# Patient Record
Sex: Male | Born: 1985 | Race: White | Hispanic: No | Marital: Single | Smoking: Current every day smoker
Health system: Southern US, Community
[De-identification: ages and names within clinical notes are randomized; demographics above are authoritative.]

## PROBLEM LIST (undated history)

## (undated) DIAGNOSIS — F329 Major depressive disorder, single episode, unspecified: Secondary | ICD-10-CM

## (undated) DIAGNOSIS — M549 Dorsalgia, unspecified: Secondary | ICD-10-CM

## (undated) DIAGNOSIS — F419 Anxiety disorder, unspecified: Secondary | ICD-10-CM

## (undated) DIAGNOSIS — F1020 Alcohol dependence, uncomplicated: Secondary | ICD-10-CM

## (undated) DIAGNOSIS — F32A Depression, unspecified: Secondary | ICD-10-CM

---

## 2010-04-12 ENCOUNTER — Emergency Department (HOSPITAL_COMMUNITY): Admission: EM | Admit: 2010-04-12 | Discharge: 2010-04-12 | Payer: Self-pay | Admitting: Emergency Medicine

## 2010-08-21 ENCOUNTER — Emergency Department (HOSPITAL_COMMUNITY): Admission: EM | Admit: 2010-08-21 | Discharge: 2010-08-21 | Payer: Self-pay | Admitting: Emergency Medicine

## 2011-04-28 ENCOUNTER — Ambulatory Visit (INDEPENDENT_AMBULATORY_CARE_PROVIDER_SITE_OTHER): Payer: Self-pay

## 2011-04-28 ENCOUNTER — Inpatient Hospital Stay (INDEPENDENT_AMBULATORY_CARE_PROVIDER_SITE_OTHER)
Admission: RE | Admit: 2011-04-28 | Discharge: 2011-04-28 | Disposition: A | Discharge status: Home / Self Care | Payer: Self-pay | Source: Ambulatory Visit | Attending: Emergency Medicine | Admitting: Emergency Medicine

## 2011-04-28 DIAGNOSIS — S61409A Unspecified open wound of unspecified hand, initial encounter: Secondary | ICD-10-CM

## 2011-10-06 ENCOUNTER — Emergency Department (HOSPITAL_COMMUNITY)
Admission: EM | Admit: 2011-10-06 | Discharge: 2011-10-06 | Disposition: A | Discharge status: Home / Self Care | Payer: Self-pay | Attending: Emergency Medicine | Admitting: Emergency Medicine

## 2011-10-06 DIAGNOSIS — F1010 Alcohol abuse, uncomplicated: Secondary | ICD-10-CM | POA: Insufficient documentation

## 2011-10-06 DIAGNOSIS — M549 Dorsalgia, unspecified: Secondary | ICD-10-CM | POA: Insufficient documentation

## 2012-01-22 ENCOUNTER — Encounter (HOSPITAL_COMMUNITY): Payer: Self-pay | Admitting: *Deleted

## 2012-01-22 ENCOUNTER — Emergency Department (HOSPITAL_COMMUNITY)
Admission: EM | Admit: 2012-01-22 | Discharge: 2012-01-22 | Disposition: A | Discharge status: Home / Self Care | Payer: Self-pay | Attending: Emergency Medicine | Admitting: Emergency Medicine

## 2012-01-22 DIAGNOSIS — X789XXA Intentional self-harm by unspecified sharp object, initial encounter: Secondary | ICD-10-CM | POA: Insufficient documentation

## 2012-01-22 DIAGNOSIS — S61509A Unspecified open wound of unspecified wrist, initial encounter: Secondary | ICD-10-CM | POA: Insufficient documentation

## 2012-01-22 DIAGNOSIS — F17200 Nicotine dependence, unspecified, uncomplicated: Secondary | ICD-10-CM | POA: Insufficient documentation

## 2012-01-22 DIAGNOSIS — S61512A Laceration without foreign body of left wrist, initial encounter: Secondary | ICD-10-CM

## 2012-01-22 DIAGNOSIS — F39 Unspecified mood [affective] disorder: Secondary | ICD-10-CM | POA: Insufficient documentation

## 2012-01-22 HISTORY — DX: Alcohol dependence, uncomplicated: F10.20

## 2012-01-22 HISTORY — DX: Anxiety disorder, unspecified: F41.9

## 2012-01-22 HISTORY — DX: Major depressive disorder, single episode, unspecified: F32.9

## 2012-01-22 HISTORY — DX: Depression, unspecified: F32.A

## 2012-01-22 LAB — COMPREHENSIVE METABOLIC PANEL
Albumin: 4 g/dL (ref 3.5–5.2)
BUN: 5 mg/dL — ABNORMAL LOW (ref 6–23)
Chloride: 105 mEq/L (ref 96–112)
Creatinine, Ser: 0.61 mg/dL (ref 0.50–1.35)
Total Bilirubin: 0.2 mg/dL — ABNORMAL LOW (ref 0.3–1.2)
Total Protein: 7.9 g/dL (ref 6.0–8.3)

## 2012-01-22 LAB — CBC
HCT: 48.8 % (ref 39.0–52.0)
MCHC: 36.5 g/dL — ABNORMAL HIGH (ref 30.0–36.0)
MCV: 87 fL (ref 78.0–100.0)
RDW: 12.9 % (ref 11.5–15.5)

## 2012-01-22 LAB — RAPID URINE DRUG SCREEN, HOSP PERFORMED: Benzodiazepines: NOT DETECTED

## 2012-01-22 LAB — ETHANOL: Alcohol, Ethyl (B): 254 mg/dL — ABNORMAL HIGH (ref 0–11)

## 2012-01-22 LAB — ACETAMINOPHEN LEVEL: Acetaminophen (Tylenol), Serum: <15 ug/mL (ref 10–30)

## 2012-01-22 MED ORDER — ACETAMINOPHEN 325 MG PO TABS
650.0000 mg | ORAL_TABLET | ORAL | Status: DC | PRN
  Administered 2012-01-22: 650 mg via ORAL
  Filled 2012-01-22: qty 2

## 2012-01-22 MED ORDER — ALUM & MAG HYDROXIDE-SIMETH 200-200-20 MG/5ML PO SUSP: 30.0000 mL | ORAL | Status: DC | PRN

## 2012-01-22 MED ORDER — LORAZEPAM 1 MG PO TABS
1.0000 mg | ORAL_TABLET | Freq: Three times a day (TID) | ORAL | Status: DC | PRN
  Administered 2012-01-22: 1 mg via ORAL
  Filled 2012-01-22: qty 1

## 2012-01-22 MED ORDER — NICOTINE 21 MG/24HR TD PT24
21.0000 mg | MEDICATED_PATCH | Freq: Every day | TRANSDERMAL | Status: DC
  Administered 2012-01-22 (×2): 21 mg via TRANSDERMAL
  Filled 2012-01-22: qty 1

## 2012-01-22 MED ORDER — LIDOCAINE-EPINEPHRINE 2 %-1:100000 IJ SOLN
30.0000 mL | Freq: Once | INTRAMUSCULAR | Status: AC
  Administered 2012-01-22: 30 mL via INTRADERMAL
  Filled 2012-01-22: qty 1

## 2012-01-22 MED ORDER — ONDANSETRON HCL 4 MG PO TABS: 4.0000 mg | ORAL_TABLET | Freq: Three times a day (TID) | ORAL | Status: DC | PRN

## 2012-01-22 NOTE — ED Notes (Signed)
Telepsych number called x 4 and the number checked x2.  Number not working.

## 2012-01-22 NOTE — BH Assessment (Signed)
Assessment Note   Leonard Pacheco is an 26 y.o. male. Pt presents to the ED stating that his moods are from one extreme to the other. Patient says, "I'm happy one moment and the next I am sad". Sts that last night he cut the top of his forearm. He denies that this was a suicide attempt. He does however state that this was a attempt to make himself feel better. He does not have a history of cutting but wanted to try and sts, "It helped him feel better". He denies that he is currently suicidal or has ever been suicidal. Patient is able to contract for safety. No HI. No drug use but patient drinks heavily 4x's per week. Patient drinks a 12 pack of beer or a 5th of liquor with each episode or drinking. He last drank last night--12 pack of beer.    Axis I: Alcohol Abuse and Mood Disorder NOS Axis II: Deferred Axis III:  Past Medical History  Diagnosis Date  . Depression   . Alcoholism   . Anxiety    Axis IV: economic problems, other psychosocial or environmental problems, problems related to social environment, problems with access to health care services and problems with primary support group Axis V: 51-60 moderate symptoms  Past Medical History:  Past Medical History  Diagnosis Date  . Depression   . Alcoholism   . Anxiety     History reviewed. No pertinent past surgical history.  Family History: History reviewed. No pertinent family history.  Social History:  reports that he has been smoking Cigarettes.  He has a 5 pack-year smoking history. He does not have any smokeless tobacco history on file. He reports that he drinks about 14.4 ounces of alcohol per week. He reports that he does not use illicit drugs.  Additional Social History:  Alcohol / Drug Use Pain Medications: pt denies Prescriptions: no home meds noted Over the Counter: pt denies History of alcohol / drug use?: Yes Longest period of sobriety (when/how long): n/a Substance #1 Name of Substance 1: Alcoholl-beer or  liqour 1 - Age of First Use: 26 yrs old 1 - Amount (size/oz): 12 pack of beer or 5th of liqour 1 - Frequency: 4x per week 1 - Duration: on-going 1 - Last Use / Amount: last night 01/21/2012 pt drank a 12 pack of beer Allergies: No Known Allergies  Home Medications:  Medications Prior to Admission  Medication Dose Route Frequency Provider Last Rate Last Dose  . acetaminophen (TYLENOL) tablet 650 mg  650 mg Oral Q4H PRN Carlisle Beers Molpus, MD   650 mg at 01/22/12 0828  . alum & mag hydroxide-simeth (MAALOX/MYLANTA) 200-200-20 MG/5ML suspension 30 mL  30 mL Oral PRN John L Molpus, MD      . lidocaine-EPINEPHrine (XYLOCAINE W/EPI) 2 %-1:100000 (with pres) injection 30 mL  30 mL Intradermal Once Peter A. Tucich, MD   30 mL at 01/22/12 0535  . LORazepam (ATIVAN) tablet 1 mg  1 mg Oral Q8H PRN Carlisle Beers Molpus, MD   1 mg at 01/22/12 0828  . nicotine (NICODERM CQ - dosed in mg/24 hours) patch 21 mg  21 mg Transdermal Daily Carlisle Beers Molpus, MD   21 mg at 01/22/12 0828  . ondansetron (ZOFRAN) tablet 4 mg  4 mg Oral Q8H PRN Hanley Seamen, MD       No current outpatient prescriptions on file as of 01/22/2012.    OB/GYN Status:  No LMP for male patient.  General  Assessment Data Location of Assessment: [facility] ED ACT Assessment:  (No) Living Arrangements: Spouse/significant other;Other (Comment) (lives with fiance) Can pt return to current living arrangement?: Yes Admission Status: Voluntary Is patient capable of signing voluntary admission?: Yes Transfer from: Acute Hospital Referral Source: Self/Family/Friend  Education Status Is patient currently in school?: No  Risk to self Suicidal Ideation: No Suicidal Intent: No Is patient at risk for suicide?: No Suicidal Plan?: No Access to Means: No What has been your use of drugs/alcohol within the last 12 months?:  (pt reports heavy alcohol use x4 days per week) Previous Attempts/Gestures: No How many times?:  (n/a) Other Self Harm Risks:  (cutting to  relieve pain, per patient) Triggers for Past Attempts:  (n/a) Intentional Self Injurious Behavior: Cutting Comment - Self Injurious Behavior:  (pt cut top of forearm last night (first time);requires stitc) Family Suicide History: No (Family History of substance abuse only) Recent stressful life event(s):  (fiance threatening to abort child, argues w/ fiance,bereavem) Persecutory voices/beliefs?: No Depression: Yes Depression Symptoms: Feeling angry/irritable;Fatigue;Loss of interest in usual pleasures Substance abuse history and/or treatment for substance abuse?: Yes (Pt received treatment in prison 2010) Suicide prevention information given to non-admitted patients: Not applicable  Risk to Others Homicidal Ideation: No Thoughts of Harm to Others: No Current Homicidal Intent: No Current Homicidal Plan: No Access to Homicidal Means: No Identified Victim:  (n/a) History of harm to others?: No Assessment of Violence: None Noted Violent Behavior Description:  (n/a) Does patient have access to weapons?: No Criminal Charges Pending?: No Does patient have a court date: No  Psychosis Hallucinations: None noted Delusions: None noted  Mental Status Report Appear/Hygiene: Disheveled Eye Contact: Fair Motor Activity: Agitation;Restlessness Speech: Logical/coherent Level of Consciousness: Alert;Irritable Mood: Depressed;Anxious;Sad Affect: Anxious;Depressed;Sad Anxiety Level: None Thought Processes: Coherent;Relevant Judgement: Unimpaired Orientation: Person;Place;Time;Situation Obsessive Compulsive Thoughts/Behaviors: None  Cognitive Functioning Concentration: Normal Memory: Recent Intact;Remote Intact IQ: Average Insight: Fair Impulse Control: Poor Appetite: Poor Weight Loss:  (15-20 pounds in 2 weeks) Weight Gain:  (none reported) Sleep: Decreased Total Hours of Sleep:  (4-5 hrs per night) Vegetative Symptoms: Staying in bed  Prior Inpatient Therapy Prior Inpatient  Therapy: Yes Prior Therapy Dates:  (2010) Prior Therapy Facilty/Provider(s):  (pt received tx while in served time in prison;2010) Reason for Treatment:  (substance abuse-alcoholism)  Prior Outpatient Therapy Prior Outpatient Therapy: Yes Prior Therapy Dates:  (2010) Prior Therapy Facilty/Provider(s):  ([facility]) Reason for Treatment:  (alcoholism)  ADL Screening (condition at time of admission) Patient's cognitive ability adequate to safely complete daily activities?: Yes Patient able to express need for assistance with ADLs?: Yes Independently performs ADLs?: Yes Weakness of Legs: None Weakness of Arms/Hands: None  Home Assistive Devices/Equipment Home Assistive Devices/Equipment: None    Abuse/Neglect Assessment (Assessment to be complete while patient is alone) Physical Abuse: Denies Verbal Abuse: Denies Sexual Abuse: Denies Exploitation of patient/patient's resources: Denies Self-Neglect: Denies Values / Beliefs Cultural Requests During Hospitalization: None Spiritual Requests During Hospitalization: None Consults Spiritual Care Consult Needed: No Social Work Consult Needed: No Merchant navy officer (For Healthcare) Advance Directive: Patient does not have advance directive Nutrition Screen Diet: Regular  Additional Information 1:1 In Past 12 Months?: No CIRT Risk: No Elopement Risk: No Does patient have medical clearance?: Yes     Disposition:  Disposition Disposition of Patient:  (Disposition pending telepsych consult)  On Site Evaluation by:   Reviewed with Physician:     Octaviano Batty 01/22/2012 10:30 AM

## 2012-01-22 NOTE — ED Notes (Signed)
Pt comes from home where he was drinking beer and thought it a good idea to cut his arm in an attempt to harm himself.  Pt presents with a 1 inch (approximately) laceration on his right lateral, distal forearm.  Pt stating that he would like to speak to a psychologist.

## 2012-01-22 NOTE — ED Provider Notes (Signed)
History     CSN: [id number]  Arrival date & time 01/22/12  0103   First MD Initiated Contact with Patient 01/22/12 [phone]      Chief Complaint  Patient presents with  . Suicidal with self-inflicted injury      HPI  History provided by the patient. Patient is 26 year old male with no known significant past medical history who presents with complaints of self-inflicted laceration to left wrist, depression and mood swings. Patient admits to heavy alcohol use this evening. Patient states that he became very emotional rapidly and angry due to the relationship and he cut his left wrist. he states he did this as active anger denies an active potential harm to himself or suicidal thoughts. he reports having similar outbursts and emotional mood swings for several months. Patient is requesting to be evaluated for possible bipolar disorder. Patient denies any SI/HI to me. Patient reports being up-to-date on his tetanus shot with last in 2010.   Past Medical History  Diagnosis Date  . Depression   . Alcoholism     History reviewed. No pertinent past surgical history.  History reviewed. No pertinent family history.  History  Substance Use Topics  . Smoking status: Current Everyday Smoker -- 1.0 packs/day for 5 years    Types: Cigarettes  . Smokeless tobacco: Not on file  . Alcohol Use: Not on file      Review of Systems  All other systems reviewed and are negative.    Allergies  Review of patient's allergies indicates no known allergies.  Home Medications  No current outpatient prescriptions on file.  BP 168/84  Pulse 98  Temp(Src) 98.1 F (36.7 C) (Oral)  Resp 18  SpO2 97%  Physical Exam  Nursing note and vitals reviewed. Constitutional: He is oriented to person, place, and time. He appears well-developed and well-nourished. No distress.  HENT:  Head: Normocephalic and atraumatic.  Cardiovascular: Normal rate and regular rhythm.   Pulmonary/Chest: Effort normal and  breath sounds normal. No respiratory distress. He has no wheezes.  Abdominal: Soft.  Neurological: He is alert and oriented to person, place, and time.  Skin: Skin is warm. No rash noted.       2 cm laceration to the dorsal left forearm over radial aspect.  Psychiatric: His behavior is normal.    ED Course  Procedures   LACERATION REPAIR Performed by: Angus Seller Authorized by: Angus Seller Consent: Verbal consent obtained. Risks and benefits: risks, benefits and alternatives were discussed Consent given by: patient Patient identity confirmed: provided demographic data Prepped and Draped in normal sterile fashion Wound explored  Laceration Location: Lateral dorsal left wrist  Laceration Length: 2 cm  No Foreign Bodies seen or palpated  Anesthesia: local infiltration  Local anesthetic: lidocaine 2% with epinephrine  Anesthetic total: 2 ml  Irrigation method: syringe Amount of cleaning: standard  Skin closure: Skin with 4-0 nylon   Number of sutures: 4   Technique: Simple interrupted   Patient tolerance: Patient tolerated the procedure well with no immediate complications.     Results for orders placed during the hospital encounter of 01/22/12  CBC      Component Value Range   WBC 10.6 (*) 4.0 - 10.5 (K/uL)   RBC 5.61  4.22 - 5.81 (MIL/uL)   Hemoglobin 17.8 (*) 13.0 - 17.0 (g/dL)   HCT 56.2  13.0 - 86.5 (%)   MCV 87.0  78.0 - 100.0 (fL)   MCH 31.7  26.0 - 34.0 (pg)  MCHC 36.5 (*) 30.0 - 36.0 (g/dL)   RDW 16.1  09.6 - 04.5 (%)   Platelets 240  150 - 400 (K/uL)  COMPREHENSIVE METABOLIC PANEL      Component Value Range   Sodium 143  135 - 145 (mEq/L)   Potassium 3.0 (*) 3.5 - 5.1 (mEq/L)   Chloride 105  96 - 112 (mEq/L)   CO2 23  19 - 32 (mEq/L)   Glucose, Bld 127 (*) 70 - 99 (mg/dL)   BUN 5 (*) 6 - 23 (mg/dL)   Creatinine, Ser 4.09  0.50 - 1.35 (mg/dL)   Calcium 9.2  8.4 - 81.1 (mg/dL)   Total Protein 7.9  6.0 - 8.3 (g/dL)   Albumin 4.0  3.5 -  5.2 (g/dL)   AST 19  0 - 37 (U/L)   ALT 22  0 - 53 (U/L)   Alkaline Phosphatase 74  39 - 117 (U/L)   Total Bilirubin 0.2 (*) 0.3 - 1.2 (mg/dL)   GFR calc non Af Amer >90  >90 (mL/min)   GFR calc Af Amer >90  >90 (mL/min)  ETHANOL      Component Value Range   Alcohol, Ethyl (B) 254 (*) 0 - 11 (mg/dL)  ACETAMINOPHEN LEVEL      Component Value Range   Acetaminophen (Tylenol), Serum <15.0  10 - 30 (ug/mL)  URINE RAPID DRUG SCREEN (HOSP PERFORMED)      Component Value Range   Opiates NONE DETECTED  NONE DETECTED    Cocaine NONE DETECTED  NONE DETECTED    Benzodiazepines NONE DETECTED  NONE DETECTED    Amphetamines NONE DETECTED  NONE DETECTED    Tetrahydrocannabinol NONE DETECTED  NONE DETECTED    Barbiturates NONE DETECTED  NONE DETECTED         1. Mood disorder   2. Laceration of wrist, left       MDM  3:20 AM patient seen and evaluated. Patient no acute distress. Patient with small laceration to left wrist with self-inflicted. Patient states he was doing this as a suicide attempt or any intention to kill himself. Patient states that he thinks he may have bipolar disorder but has never been evaluated and is requesting psychology consult.  Spoke with [facility] act team they will see patient in consult. Discussed patient with attending physician. Will also order tele-psych consultation.      Angus Seller, [place] 01/22/12 [phone]

## 2012-01-22 NOTE — ED Notes (Signed)
Pt sleeping soundly, resp even and unlabored.  Pt waiting for consult and disposition.

## 2012-01-22 NOTE — [facility] Counselor (Signed)
Patient discharged home by Dr. Patrica Duel per the telepsych consult. Writer provided patient with various referrals for therapist, psychiatrist, and various substance abuse programs.

## 2012-01-22 NOTE — ED Provider Notes (Signed)
Medical screening examination/treatment/procedure(s) were conducted as a shared visit with non-physician practitioner(s) and myself.  I personally evaluated the patient during the encounter  Laceration to left wrist, sutured by PA. Patient advised of plan.  Hanley Seamen, MD 01/22/12 [phone]

## 2012-01-22 NOTE — ED Notes (Signed)
Pt now awake, denies any pain at this time.  States he is just hungry. Pt denies any c/o at this time.

## 2012-01-22 NOTE — ED Provider Notes (Addendum)
8:42 AM'  Psychiatric evaluation is ongoing. Disposition is currently pending. Patient has no current complaints and remains stable at this time.    Jahlon Baines A. Patrica Duel, MD 01/22/12 [phone]   Telepsych was completed in regard to the impulsivity, left forearm when he was angry with his girlfriend. Patient had apparently realized that his brother and his girlfriend were having a relation. According to the psychiatrist and the Telepsych note that there was no evidence of delusional thought process or cognitive impairment. He denied any plan or intent to harm himself or others. He does have some alcohol problems in the psychiatrist recommended benefit from outpatient treatment. Otherwise, per Telepsych height is okay to discharge home. Recommend alcohol and drug program and community mental health. Third for discharge. No psych medications indicated at this time. According to Dr. Renee Ramus A. Patrica Duel, MD 01/22/12 1242

## 2012-01-22 NOTE — ED Notes (Signed)
It is confirmed with security that the pt and his belongings have been wanded by security.  Pt's belongings are at the nurse's desk.

## 2012-01-22 NOTE — ED Notes (Signed)
Pt states he has been depressed since his father's death in 06-02-09.  Abusing alcohol.  Says others are trying to use him and he is tired of it.  Denies SI/HI but made a 2cm lac on left forearm.  Been drinking alcohol.

## 2015-07-18 ENCOUNTER — Encounter (HOSPITAL_COMMUNITY): Payer: Self-pay | Admitting: *Deleted

## 2015-07-18 ENCOUNTER — Emergency Department (HOSPITAL_COMMUNITY)
Admission: EM | Admit: 2015-07-18 | Discharge: 2015-07-18 | Disposition: A | Discharge status: Home / Self Care | Payer: Self-pay | Attending: Emergency Medicine | Admitting: Emergency Medicine

## 2015-07-18 DIAGNOSIS — Z8659 Personal history of other mental and behavioral disorders: Secondary | ICD-10-CM | POA: Insufficient documentation

## 2015-07-18 DIAGNOSIS — T50901A Poisoning by unspecified drugs, medicaments and biological substances, accidental (unintentional), initial encounter: Secondary | ICD-10-CM

## 2015-07-18 DIAGNOSIS — R61 Generalized hyperhidrosis: Secondary | ICD-10-CM | POA: Insufficient documentation

## 2015-07-18 DIAGNOSIS — R002 Palpitations: Secondary | ICD-10-CM | POA: Insufficient documentation

## 2015-07-18 DIAGNOSIS — Z720 Tobacco use: Secondary | ICD-10-CM | POA: Insufficient documentation

## 2015-07-18 DIAGNOSIS — R000 Tachycardia, unspecified: Secondary | ICD-10-CM | POA: Insufficient documentation

## 2015-07-18 DIAGNOSIS — T44901A Poisoning by unspecified drugs primarily affecting the autonomic nervous system, accidental (unintentional), initial encounter: Secondary | ICD-10-CM | POA: Insufficient documentation

## 2015-07-18 HISTORY — DX: Dorsalgia, unspecified: M54.9

## 2015-07-18 LAB — CBC WITH DIFFERENTIAL/PLATELET
Basophils Absolute: 0 10*3/uL (ref 0.0–0.1)
Basophils Relative: 0 % (ref 0–1)
EOS ABS: 0.1 10*3/uL (ref 0.0–0.7)
EOS PCT: 1 % (ref 0–5)
HCT: 46.2 % (ref 39.0–52.0)
HEMOGLOBIN: 16.1 g/dL (ref 13.0–17.0)
LYMPHS ABS: 1.6 10*3/uL (ref 0.7–4.0)
Lymphocytes Relative: 11 % — ABNORMAL LOW (ref 12–46)
MCH: 30.6 pg (ref 26.0–34.0)
MCHC: 34.8 g/dL (ref 30.0–36.0)
MCV: 87.8 fL (ref 78.0–100.0)
MONO ABS: 0.6 10*3/uL (ref 0.1–1.0)
Monocytes Relative: 4 % (ref 3–12)
NEUTROS ABS: 12.1 10*3/uL — AB (ref 1.7–7.7)
NEUTROS PCT: 84 % — AB (ref 43–77)
PLATELETS: 221 10*3/uL (ref 150–400)
RBC: 5.26 MIL/uL (ref 4.22–5.81)
RDW: 12.7 % (ref 11.5–15.5)
WBC: 14.4 10*3/uL — AB (ref 4.0–10.5)

## 2015-07-18 LAB — I-STAT CHEM 8, ED
BUN: 29 mg/dL — AB (ref 6–20)
Calcium, Ion: 1.07 mmol/L — ABNORMAL LOW (ref 1.12–1.23)
Chloride: 103 mmol/L (ref 101–111)
Creatinine, Ser: 0.9 mg/dL (ref 0.61–1.24)
Glucose, Bld: 234 mg/dL — ABNORMAL HIGH (ref 65–99)
HCT: 46 % (ref 39.0–52.0)
Hemoglobin: 15.6 g/dL (ref 13.0–17.0)
POTASSIUM: 3.6 mmol/L (ref 3.5–5.1)
SODIUM: 138 mmol/L (ref 135–145)
TCO2: 23 mmol/L (ref 0–100)

## 2015-07-18 MED ORDER — SODIUM CHLORIDE 0.9 % IV BOLUS (SEPSIS)
1000.0000 mL | Freq: Once | INTRAVENOUS | Status: AC
  Administered 2015-07-18: 1000 mL via INTRAVENOUS

## 2015-07-18 MED ORDER — IBUPROFEN 400 MG PO TABS
400.0000 mg | ORAL_TABLET | Freq: Once | ORAL | Status: AC
  Administered 2015-07-18: 400 mg via ORAL
  Filled 2015-07-18: qty 1

## 2015-07-18 MED ORDER — LORAZEPAM 2 MG/ML IJ SOLN: 1.0000 mg | Freq: Once | INTRAMUSCULAR | Status: DC

## 2015-07-18 NOTE — Discharge Instructions (Signed)
°Emergency Department Resource Guide °1) Find a Doctor and Pay Out of Pocket °Although you won't have to find out who is covered by your insurance plan, it is a good idea to ask around and get recommendations. You will then need to call the office and see if the doctor you have chosen will accept you as a new patient and what types of options they offer for patients who are self-pay. Some doctors offer discounts or will set up payment plans for their patients who do not have insurance, but you will need to ask so you aren't surprised when you get to your appointment. ° °2) Contact Your Local Health Department °Not all health departments have doctors that can see patients for sick visits, but many do, so it is worth a call to see if yours does. If you don't know where your local health department is, you can check in your phone book. The CDC also has a tool to help you locate your state's health department, and many state websites also have listings of all of their local health departments. ° °3) Find a Walk-in Clinic °If your illness is not likely to be very severe or complicated, you may want to try a walk in clinic. These are popping up all over the country in pharmacies, drugstores, and shopping centers. They're usually staffed by nurse practitioners or physician assistants that have been trained to treat common illnesses and complaints. They're usually fairly quick and inexpensive. However, if you have serious medical issues or chronic medical problems, these are probably not your best option. ° °No Primary Care Doctor: °- Call [facility] at  832-8000 - they can help you locate a primary care doctor that  accepts your insurance, provides certain services, etc. °- Physician Referral Service- 1-800-533-3463 ° °Chronic Pain Problems: °Organization         Address  Phone   Notes  °[facility]  (336) [phone] Patients need to be referred by their primary care doctor.  ° °Medication  Assistance: °Organization         Address  Phone   Notes  °[place] [facility] [address] °[place], [place] [address] (336) 641-8030 --Must be a resident of [place] °-- Must have NO insurance coverage whatsoever (no Medicaid/ Medicare, etc.) °-- The pt. MUST have a primary care doctor that directs their care regularly and follows them in the community °  °[facility]  (866) 331-1348   °[employer]  (888) 892-1162   ° °Agencies that provide inexpensive medical care: °Organization         Address  Phone   Notes  °[facility]  (336) 832-8035   °[facility]    (336) 832-7272   °[facility] [address] °[place], [place] [address] (336) 832-4777   °[facility] [address], °[place] (336) 271-4999   °[facility]    (336) 373-0678   °[facility]    (336) 272-1050   °[facility] ° [address], [place] Phone:  (336) 832-4444, Fax:  (336) 832-4440 Hours of Operation:  9 am - 6 pm, M-F.  Also accepts Medicaid/Medicare and self-pay.  °[facility] ° [address], [place] Phone: (336) [phone], Fax: (336) [phone]. Hours of Operation:  8:30 am - 5:30 pm, M-F.  Also accepts Medicaid and self-pay.  °[facility] [place] [address], [place] Phone: (336) 878-6027   °[facility] [address], [place], [place] (336)723-1848, Ext. 123 Mondays & Thursdays: 7-9 AM.  First 15 patients are seen on a first come, first serve basis. °  ° °Medicaid-accepting [place] Providers: ° °Organization         Address  Phone   Notes  °[facility] [address], [place] (336) [phone] Also accepts self-pay patients.  °[facility] [address], [place] ° (336) 856-9996   °[facility] [address], [place]  (336) 288-8857   °[facility] [address] [place] [address], [place] (336) 299-7000   °Veita Bland [address], [place]  ° (336) 373-1557 Only accepts [place] Access Medicaid patients after they have their name applied to their card.  ° °Self-Pay (no insurance) in [place]: ° °Organization         Address  Phone   Notes  °Sickle Cell Patients, [facility] [address], [place] (336) 832-1970   °[facility] [address], [place] (336) 832-4400   °[facility] ° [address], [place] (336) 992-4800   °[facility]/Dr. Osei-Bonsu ° [address] [place] [address], [place] or [address], [place] (336) [phone] Phone number for both [place] and [place] locations is the same.  °[facility] [address], [place] (336) 299-0000   °[facility] [address] [place] [address], [place] or [address] (336) 852-7530 °(336) 878-2260   °[facility] [address], [place] (336) 350-1642, phone; (336) 294-5005, fax Sees patients 1st and 3rd Saturday of every month.  Must not qualify for public or private insurance (i.e. Medicaid, Medicare, [place] Health Choice, Veterans' Benefits) • Household income should be no more than 200% of the poverty level •The clinic cannot treat you if you are pregnant or think you are pregnant • Sexually transmitted diseases are not treated at the clinic.  ° ° °Dental Care: °Organization         Address  Phone  Notes  °[place] [facility] [address], [place] (336) 641-6152 Accepts children up to age 21 who are enrolled in Medicaid or [place] Health Choice; pregnant women with a Medicaid card; and children who have applied for Medicaid or [place] Health Choice, but were declined, whose parents can pay a reduced fee at time of service.  °[place]  [facility] [place]  [address], [place] (336) 641-7733 Accepts children up to age 21 who are enrolled in Medicaid or [place] Health Choice; pregnant women with a Medicaid card; and children who have applied for Medicaid or [place] Health Choice, but were declined, whose parents can pay a reduced fee at time of service.  °[facility] ° [address], [place] (336) 641-4533 Patients are seen by appointment only. Walk-ins are not accepted. [facility] will see patients 18 years of age and older. °Monday - Tuesday (8am-5pm) °Most Wednesdays (8:30-5pm) °$30 per visit, cash only  °[facility] ° [address], [place] (336) 641-4533 Patients are seen by appointment only. Walk-ins are not accepted. [facility] will see patients 18 years of age and older. °One   Wednesday Evening (Monthly: Volunteer Based).  $30 per visit, cash only  °[facility]  (919) [phone] for adults; Children under age 4, call Graduate Pediatric Dentistry at ([phone]) [phone]. Children aged 4-14, please call ([phone]) [phone] to request a pediatric application. ° Dental services are provided in all areas of dental care including fillings, crowns and bridges, complete and partial dentures, implants, gum treatment, root canals, and extractions. Preventive care is also provided. Treatment is provided to both adults and children. °Patients are selected via a lottery and there is often a waiting list. °  °[facility] [address], °[place] ° (336) 763-8833 [email or web address] °  °[facility] [address], [place], [place] (336)723-1848, Ext. 123 Second and Fourth Thursday of each month, opens at 6:30 AM; Clinic ends at 9 AM.  Patients are seen on a first-come first-served basis, and a limited number are seen during each clinic.  ° °[facility] ° [address], [place], [place] (336) [phone]    Eligibility Requirements °You must have lived in [place], [place], or [place] counties for at least the last three months. °  You cannot be eligible for state or federal sponsored healthcare insurance, including Veterans Administration, Medicaid, or Medicare. °  You generally cannot be eligible for healthcare insurance through your employer.  °  How to apply: °Eligibility screenings are held every Tuesday and Wednesday afternoon from 1:00 pm until 4:00 pm. You do not need an appointment for the interview!  °[facility] [address], [place], [place] 336-631-2330   °[facility]  336-342-8273   °[facility]  336-703-3100   °[facility]  336-570-6415   ° °Behavioral Health Resources in the Community: °Intensive Outpatient Programs °Organization         Address  Phone  Notes  °[facility] [address], [place], [place] 336-878-6098   °[facility] [address], [place], [place] 336-832-9800   °[facility] [address], [place], [place] ° 336-882-2125   °[facility] [address],  °[place], [place] 1-800-853-5163 or 336-641-4981   °Substance Abuse Resources °Organization         Address  Phone  Notes  °[facility]  336-882-2125   °[facility]  336-784-9470   °[facility]  336-285-9073   °[facility]  336-845-3988   °Residential & Outpatient Substance Abuse Program  1-800-659-3381   °Psychological Services °Organization         Address  Phone  Notes  °[facility]  336- 832-9600   °[facility]  336- 378-7881   °[facility] [address], [place] 1-800-853-5163 or 336-641-4981   ° °Mobile Crisis Teams °Organization         Address  Phone  Notes  °[facility], Mobile Crisis Care Unit  1-877-626-1772   °Assertive °[facility] ° [address]  [place], [place] 336-834-9664   °Sharon DeEsch [address] °[place] 336-554-5454   ° °Self-Help/Support Groups °Organization         Address  Phone             Notes  °[facility]  - variety of support groups  336- 373-1402 Call for more information  °Narcotics Anonymous (NA), [facility] [address], °[place]  2 meetings at this location  ° °  Residential Treatment Programs Organization         Address  Phone  Notes  [facility] [address]    [place]  1-817-486-3740   [facility]  [address] [place] [address], [place], [place]   [place], [place] (920)[phone] Admissions: 8am-3pm M-F  [facility] [address]    [place], [place] 893-810-1751   [facility] [address] [place], [place], [place]   [facility] [address],  [place], [place]   [facility] Intensive Outpatient [address], [place], [place]   [facility] ([place]  [place], [place] 1-343-317-1586 or 332-253-0270   [facility] ([facility] [address] [place], [place] Accepts Medicaid  [facility] [address],  [place] 1-(445) 494-1659 Substance Abuse/Addiction Treatment   [facility]         Address  Phone  Notes  [facility]  678-162-4555   Domenic Schwab, PhD [address] [place], [place]   856-161-8914 or (231) 311-2104   Amanda Jerome [place], [place] 209-795-7326   [facility] [address] [place], [place] 813-186-0707 Insurance/Medicaid/sponsorship through [facility] [address]                                    [place], [place] [phone] [place], [place] [phone]    Dr. Adele Schilder  ([phone]   [facility] Dept. 1) [address]  2) [place] 3)  [address], [place] ([phone]) [phone]  941-590-0559   [place] (256)164-6925 or 667-311-5740 (After Hours)       Accidental Overdose A drug overdose occurs when a chemical substance (drug or medication) is used in amounts large enough to overcome a person. This may result in severe illness or death. This is a type of poisoning. Accidental overdoses of medications or other substances come from a variety of reasons. When this happens accidentally, it is often because the person taking the substance does not know enough about what they have taken. Drugs which commonly cause overdose deaths are alcohol, psychotropic medications (medications which affect the mind), pain medications, illegal drugs (street drugs) such as cocaine and heroin, and multiple drugs taken at the same time. It may result from careless behavior (such as over-indulging at a party). Other causes of overdose may include multiple drug use, a lapse in memory, or drug use after a period of no drug use.  Sometimes overdosing occurs because a person cannot remember if they have taken their medication.  A common unintentional overdose in young children involves multi-vitamins containing iron. Iron is a part of the hemoglobin molecule in blood. It is used to transport oxygen to living cells. When taken in small amounts, iron allows the body to restock hemoglobin. In large amounts, it causes problems in the body. If this overdose is not treated, it can lead to death. Never take medicines that show signs of tampering or do not seem  quite right. Never take medicines in the dark or in poor lighting. Read the label and check each dose of medicine before you take it.  When adults are poisoned, it happens most often through carelessness or lack of information. Taking medicines in the dark or taking medicine prescribed for someone else to treat the same type of problem is a dangerous practice. SYMPTOMS  Symptoms of overdose depend on the medication and amount taken. They can vary from over-activity with stimulant over-dosage, to sleepiness from depressants such as alcohol, narcotics and tranquilizers. Confusion, dizziness, nausea and vomiting may be present. If problems are severe enough coma and death may result. DIAGNOSIS  Diagnosis and management are generally straightforward if the drug is known. Otherwise it is more difficult. At times, certain symptoms and signs exhibited by the patient, or blood tests, can reveal the drug in question.  TREATMENT  In an emergency department, most patients can be treated with supportive measures. Antidotes may be available if there has been an overdose of opioids or benzodiazepines. A rapid improvement will often occur if this is the cause of overdose. At home or away from medical care:  There may be no immediate problems or warning signs in children.  Not everything works well in all cases of poisoning.  Take immediate action. Poisons may act quickly.  If you think someone has swallowed medicine or a household product, and the person is unconscious, having seizures (convulsions), or is not breathing, immediately call for an ambulance. IF a person is conscious and appears to be doing OK but has swallowed a poison:  Do not wait to see what effect the poison will have. Immediately call a poison control center (listed in the white pages of your telephone book under "Poison Control" or inside the front cover with other emergency numbers). Some poison control centers have TTY capability for the deaf. Check with your local center if you or someone in your family requires this service.  Keep the container so you can read the label  on the product for ingredients.  Describe what, when, and how much was taken and the age and condition of the person poisoned. Inform them if the person is vomiting, choking, drowsy, shows a change in color or temperature of skin, is conscious or unconscious, or is convulsing.  Do not cause vomiting unless instructed by medical personnel. Do not induce vomiting or force liquids into a person who is convulsing, unconscious, or very drowsy. Stay calm and in control.   Activated charcoal also is sometimes used in certain types of poisoning and you may wish to add a supply to your emergency medicines. It is available without a prescription. Call a poison control center before using this medication. PREVENTION  Thousands of children die every year from unintentional poisoning. This may be from household chemicals, poisoning from carbon monoxide in a car, taking their parent's medications, or simply taking a few iron pills or vitamins with iron. Poisoning comes from unexpected sources.  Store medicines out of the sight and reach of children, preferably in a locked cabinet. Do not keep medications in a food cabinet. Always store your medicines in a secure place. Get rid of expired medications.  If you have children living with you or have them as occasional guests, you should have child-resistant caps on your medicine containers. Keep everything out of reach. Child proof your home.  If you are called to the telephone or to answer the door while you are taking a medicine, take  the container with you or put the medicine out of the reach of small children.  Do not take your medication in front of children. Do not tell your child how good a medication is and how good it is for them. They may get the idea it is more of a treat.  If you are an adult and have accidentally taken an overdose, you need to consider how this happened and what can be done to prevent it from happening again. If this was from a street  drug or alcohol, determine if there is a problem that needs addressing. If you are not sure a problems exists, it is easy to talk to a professional and ask them if they think you have a problem. It is better to handle this problem in this way before it happens again and has a much worse consequence. Document Released: 02/10/2005 Document Revised: 02/19/2012 Document Reviewed: 07/19/2009 [facility] Patient Information 2015 [place], [place]. This information is not intended to replace advice given to you by your health care provider. Make sure you discuss any questions you have with your health care provider.

## 2015-07-18 NOTE — ED Notes (Signed)
Discharge instructions reviewed, voiced understanding.  Patient and SO to the waiting room to find someone to come for them.  Reviewed the need to establish a PMD

## 2015-07-18 NOTE — ED Notes (Signed)
Patient presents via EMS.  EMS reports upon arrival found him to be apneic and blue from waist up and sats 52% with jaws clinched.  Unable to intubate they started an IV and adm Narcan .  Patient became alert and oriented and slightly agitated.   Admitted to EMS that he took some Roxi which are his, also took some Percocet and has been drinking since getting off work yesterday.  Admitted to MD that he did do some cocaine today.  EMS reported BP 122/66, P 170, CBG 343 sats 99%

## 2015-07-18 NOTE — ED Provider Notes (Signed)
History   Chief Complaint  Patient presents with  . Drug Overdose  . Alcohol Intoxication    HPI 29 year old male with past history as below presents via EMS for evaluation of drug overdose. Patient reports that he has been drinking alcohol since yesterday evening and in the meantime is also used 1 g of cocaine as well as several Roxicodone and Percocets. He is unsure how much Roxicodone and Percocet he took. EMS found patient unresponsive, gray with sats in the 50s. They administered 2 mg of Narcan and patient woke up. Patient was tachycardic with normal blood pressure during transport. Initial EKG for them showed A. fib with RVR. Patient denies any history of cardiac problems in the past. He denies attempt to harm himself. Patient reports he uses alcohol and occasional narcotics but states the cocaine was new for him today. Patient denies any chest pain, shortness of breath, abdominal pain, nausea, vomiting, recent illness. He does report having some mild palpitations. No other complaints this time.  Past medical/surgical history, social history, medications, allergies and FH have been reviewed with patient and/or in documentation. Furthermore, if pt family or friend(s) present, additional historical information was obtained from them.  Past Medical History  Diagnosis Date  . Depression   . Alcoholism   . Anxiety   . Back pain    History reviewed. No pertinent past surgical history. History reviewed. No pertinent family history. History  Substance Use Topics  . Smoking status: Current Every Day Smoker -- 1.00 packs/day for 5 years    Types: Cigarettes  . Smokeless tobacco: Current User  . Alcohol Use: 14.4 oz/week    24 Cans of beer per week     Review of Systems Constitutional: - F/C, -fatigue.  HENT: - congestion, -rhinorrhea, -sore throat.   Eyes: - eye pain, -visual disturbance.  Respiratory: - cough, -SOB, -hemoptysis.   Cardiovascular: - CP, +palps.  Gastrointestinal:  - N/V/D, -abd pain  Genitourinary: - flank pain, -dysuria, -frequency.  Musculoskeletal: - myalgia/arthritis, -joint swelling, -gait abnormality, -back pain, -neck pain/stiffness, -leg pain/swelling.  Skin: - rash/lesion.  Neurological: - focal weakness, -lightheadedness, -dizziness, -numbness, -HA.  All other systems reviewed and are negative.   Physical Exam  Physical Exam  ED Triage Vitals  Enc Vitals Group     BP 07/18/15 1915 121/98 mmHg     Pulse Rate 07/18/15 1915 100     Resp 07/18/15 1930 19     Temp 07/18/15 1931 97.7 F (36.5 C)     Temp Source 07/18/15 1931 Oral     SpO2 07/18/15 1915 96 %     Weight 07/18/15 1931 225 lb (102.059 kg)     Height 07/18/15 1931  (1.753 m)     Head Cir --      Peak Flow --      Pain Score --      Pain Loc --      Pain Edu? --      Excl. in GC? --    Constitutional: Patient is well appearing and in no acute distress Head: Normocephalic and atraumatic.  Eyes: Extraocular motion intact, no scleral icterus Mouth: MMM, OP clear Neck: Supple without meningismus, mass, or overt JVD Respiratory: No respiratory distress. Normal WOB. No w/r/g. CV: Tachycardic, no obvious murmurs.  Pulses +2 and symmetric. Euvolemic Abdomen: Soft, NT, ND, no r/g. No mass.  MSK: Extremities are atraumatic without deformity, ROM intact Skin: Warm, dry, intact without rash Neuro: AAOx4, MAE 5/5 sym, no  focal deficit noted   ED Course  Procedures   Labs Reviewed  CBC WITH DIFFERENTIAL/PLATELET - Abnormal; Notable for the following:    WBC 14.4 (*)    Neutrophils Relative % 84 (*)    Neutro Abs 12.1 (*)    Lymphocytes Relative 11 (*)    All other components within normal limits  I-STAT CHEM 8, ED - Abnormal; Notable for the following:    BUN 29 (*)    Glucose, Bld 234 (*)    Calcium, Ion 1.07 (*)    All other components within normal limits   I personally reviewed and interpreted all labs.  No results found. I personally viewed above  image(s) which were used in my medical decision making. Formal interpretations by Radiology.   EKG Interpretation  Date/Time:  "Sunday July 18 2015 19:44:44 EDT Ventricular Rate:  102 PR Interval:  156 QRS Duration: 93 QT Interval:  345 QTC Calculation: 449 R Axis:   47 Text Interpretation:  Sinus tachycardia Borderline T abnormalities, lateral leads When compared with ECG of earlier today, Sinus tachycardia has replaced Atrial fibrillation with rapid ventricular response Confirmed by GLICK  MD, DAVID ([id number]) on 07/18/2015 7:51:16 PM       MDM: Leonard Pacheco is a 28 y.o. male with H&P as above who p/w CC: Drug overdose  On arrival, patient is tachycardic to 200s and otherwise hemodynamically stable. His pupils are 5 mm and reactive. Patient is slightly diaphoretic. I suspect sympathomimetic overdose. EMS reports patient was hypoxic, gray, sats in the 50s prior to administering Narcan. Patient likely had polysubstance overdose.  Supportive care was begun. IV fluids were given. EKG  Sheshows sinus tachycardia.  Following IV fluids, patient's heart rate improved significantly. Patient was monitored in the ED for several hours and remained stable without decompensation. Screening labs are unremarkable. Patient is deemed stable for discharge.  Old records reviewed (if available). Labs and imaging reviewed personally by myself and considered in medical decision making if ordered.  Clinical Impression: 1. Drug overdose, accidental or unintentional, initial encounter   2. Overdose of sympathomimetic agent, accidental or unintentional, initial encounter     Disposition: Discharge  Condition: Good  I have discussed the results, Dx and Tx plan with the pt(& family if present). He/she/they expressed understanding and agree(s) with the plan. Discharge instructions discussed at great length. Strict return precautions discussed and pt &/or family have verbalized understanding of the  instructions. No further questions at time of discharge.    There are no discharge medications for this patient.   Follow Up: [facility] EMERGENCY DEPARTMENT [address] [id number] mc [place] [address] [phone]  If symptoms worsen  [facility]     [address] [place] [address] [phone]  As needed   Pt seen in conjunction with Dr. Birdena Crandall, DO [facility] Emergency Medicine Resident - PGY-3      Ames Dura, MD 07/19/15 [id number]  Dione Booze, MD 07/19/15 [phone]

## 2018-08-02 ENCOUNTER — Emergency Department (HOSPITAL_COMMUNITY): Payer: Self-pay

## 2018-08-02 ENCOUNTER — Encounter (HOSPITAL_COMMUNITY): Payer: Self-pay | Admitting: Emergency Medicine

## 2018-08-02 ENCOUNTER — Emergency Department (HOSPITAL_COMMUNITY)
Admission: EM | Admit: 2018-08-02 | Discharge: 2018-08-02 | Disposition: A | Discharge status: Home / Self Care | Payer: Self-pay | Attending: Emergency Medicine | Admitting: Emergency Medicine

## 2018-08-02 ENCOUNTER — Other Ambulatory Visit: Payer: Self-pay

## 2018-08-02 DIAGNOSIS — R740 Nonspecific elevation of levels of transaminase and lactic acid dehydrogenase [LDH]: Secondary | ICD-10-CM

## 2018-08-02 DIAGNOSIS — Y9301 Activity, walking, marching and hiking: Secondary | ICD-10-CM | POA: Insufficient documentation

## 2018-08-02 DIAGNOSIS — S91311A Laceration without foreign body, right foot, initial encounter: Secondary | ICD-10-CM | POA: Insufficient documentation

## 2018-08-02 DIAGNOSIS — R40241 Glasgow coma scale score 13-15, unspecified time: Secondary | ICD-10-CM | POA: Insufficient documentation

## 2018-08-02 DIAGNOSIS — F17210 Nicotine dependence, cigarettes, uncomplicated: Secondary | ICD-10-CM | POA: Insufficient documentation

## 2018-08-02 DIAGNOSIS — Y999 Unspecified external cause status: Secondary | ICD-10-CM | POA: Insufficient documentation

## 2018-08-02 DIAGNOSIS — R7401 Elevation of levels of liver transaminase levels: Secondary | ICD-10-CM

## 2018-08-02 DIAGNOSIS — R7989 Other specified abnormal findings of blood chemistry: Secondary | ICD-10-CM | POA: Insufficient documentation

## 2018-08-02 DIAGNOSIS — Y92410 Unspecified street and highway as the place of occurrence of the external cause: Secondary | ICD-10-CM | POA: Insufficient documentation

## 2018-08-02 DIAGNOSIS — F1910 Other psychoactive substance abuse, uncomplicated: Secondary | ICD-10-CM | POA: Insufficient documentation

## 2018-08-02 DIAGNOSIS — S8251XA Displaced fracture of medial malleolus of right tibia, initial encounter for closed fracture: Secondary | ICD-10-CM | POA: Insufficient documentation

## 2018-08-02 DIAGNOSIS — F1029 Alcohol dependence with unspecified alcohol-induced disorder: Secondary | ICD-10-CM | POA: Insufficient documentation

## 2018-08-02 DIAGNOSIS — Z23 Encounter for immunization: Secondary | ICD-10-CM | POA: Insufficient documentation

## 2018-08-02 LAB — CBC
HCT: 44.4 % (ref 39.0–52.0)
Hemoglobin: 14.9 g/dL (ref 13.0–17.0)
MCH: 29.6 pg (ref 26.0–34.0)
MCHC: 33.6 g/dL (ref 30.0–36.0)
MCV: 88.3 fL (ref 78.0–100.0)
Platelets: 241 10*3/uL (ref 150–400)
RBC: 5.03 MIL/uL (ref 4.22–5.81)
RDW: 13.3 % (ref 11.5–15.5)
WBC: 11.9 10*3/uL — ABNORMAL HIGH (ref 4.0–10.5)

## 2018-08-02 LAB — COMPREHENSIVE METABOLIC PANEL
ALT: 281 U/L — ABNORMAL HIGH (ref 0–44)
AST: 133 U/L — ABNORMAL HIGH (ref 15–41)
Albumin: 3.8 g/dL (ref 3.5–5.0)
Alkaline Phosphatase: 73 U/L (ref 38–126)
Anion gap: 11 (ref 5–15)
BUN: 6 mg/dL (ref 6–20)
CO2: 21 mmol/L — ABNORMAL LOW (ref 22–32)
Calcium: 9 mg/dL (ref 8.9–10.3)
Chloride: 103 mmol/L (ref 98–111)
Creatinine, Ser: 0.67 mg/dL (ref 0.61–1.24)
GFR calc Af Amer: >60 mL/min (ref >60)
GFR calc non Af Amer: >60 mL/min (ref >60)
Glucose, Bld: 135 mg/dL — ABNORMAL HIGH (ref 70–99)
Potassium: 3.3 mmol/L — ABNORMAL LOW (ref 3.5–5.1)
Sodium: 135 mmol/L (ref 135–145)
Total Bilirubin: 0.6 mg/dL (ref 0.3–1.2)
Total Protein: 7.7 g/dL (ref 6.5–8.1)

## 2018-08-02 LAB — RAPID URINE DRUG SCREEN, HOSP PERFORMED
Amphetamines: NOT DETECTED
Barbiturates: NOT DETECTED
Benzodiazepines: NOT DETECTED
Cocaine: NOT DETECTED
Opiates: POSITIVE — AB
Tetrahydrocannabinol: NOT DETECTED

## 2018-08-02 LAB — I-STAT CHEM 8, ED
BUN: 6 mg/dL (ref 6–20)
CALCIUM ION: 1.09 mmol/L — AB (ref 1.15–1.40)
Chloride: 103 mmol/L (ref 98–111)
Creatinine, Ser: 0.9 mg/dL (ref 0.61–1.24)
Glucose, Bld: 133 mg/dL — ABNORMAL HIGH (ref 70–99)
HEMATOCRIT: 46 % (ref 39.0–52.0)
Hemoglobin: 15.6 g/dL (ref 13.0–17.0)
Potassium: 3.4 mmol/L — ABNORMAL LOW (ref 3.5–5.1)
SODIUM: 137 mmol/L (ref 135–145)
TCO2: 20 mmol/L — AB (ref 22–32)

## 2018-08-02 LAB — URINALYSIS, ROUTINE W REFLEX MICROSCOPIC
Bacteria, UA: NONE SEEN
Bilirubin Urine: NEGATIVE
Glucose, UA: NEGATIVE mg/dL
Hgb urine dipstick: NEGATIVE
Ketones, ur: NEGATIVE mg/dL
Leukocytes, UA: NEGATIVE
Nitrite: NEGATIVE
Protein, ur: NEGATIVE mg/dL
Specific Gravity, Urine: 1.003 — ABNORMAL LOW (ref 1.005–1.030)
pH: 5 (ref 5.0–8.0)

## 2018-08-02 LAB — SAMPLE TO BLOOD BANK

## 2018-08-02 LAB — CDS SEROLOGY

## 2018-08-02 LAB — ETHANOL: Alcohol, Ethyl (B): 232 mg/dL — ABNORMAL HIGH (ref <10)

## 2018-08-02 LAB — PROTIME-INR
INR: 1.03
Prothrombin Time: 13.4 seconds (ref 11.4–15.2)

## 2018-08-02 MED ORDER — SODIUM CHLORIDE 0.9 % IV SOLN
INTRAVENOUS | Status: AC | PRN
  Administered 2018-08-02: 1000 mL via INTRAVENOUS

## 2018-08-02 MED ORDER — LIDOCAINE-EPINEPHRINE (PF) 2 %-1:200000 IJ SOLN
10.0000 mL | Freq: Once | INTRAMUSCULAR | Status: AC
  Administered 2018-08-02: 10 mL via INTRADERMAL

## 2018-08-02 MED ORDER — TETANUS-DIPHTH-ACELL PERTUSSIS 5-2.5-18.5 LF-MCG/0.5 IM SUSP: 0.5000 mL | Freq: Once | INTRAMUSCULAR | Status: DC

## 2018-08-02 MED ORDER — MORPHINE SULFATE (PF) 4 MG/ML IV SOLN
6.0000 mg | Freq: Once | INTRAVENOUS | Status: AC
  Administered 2018-08-02: 6 mg via INTRAVENOUS
  Filled 2018-08-02: qty 2

## 2018-08-02 MED ORDER — LACTATED RINGERS IV BOLUS
1000.0000 mL | Freq: Once | INTRAVENOUS | Status: AC
  Administered 2018-08-02: 1000 mL via INTRAVENOUS

## 2018-08-02 MED ORDER — CEPHALEXIN 500 MG PO CAPS: 500.0000 mg | ORAL_CAPSULE | Freq: Three times a day (TID) | ORAL | 0 refills | Status: AC

## 2018-08-02 MED ORDER — TETANUS-DIPHTH-ACELL PERTUSSIS 5-2.5-18.5 LF-MCG/0.5 IM SUSP
INTRAMUSCULAR | Status: AC
  Filled 2018-08-02: qty 0.5

## 2018-08-02 MED ORDER — TETANUS-DIPHTH-ACELL PERTUSSIS 5-2.5-18.5 LF-MCG/0.5 IM SUSP
0.5000 mL | Freq: Once | INTRAMUSCULAR | Status: AC
  Administered 2018-08-02: 0.5 mL via INTRAMUSCULAR

## 2018-08-02 MED ORDER — IBUPROFEN 600 MG PO TABS: 600.0000 mg | ORAL_TABLET | Freq: Four times a day (QID) | ORAL | 0 refills | Status: DC | PRN
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
## 2018-08-02 NOTE — ED Notes (Signed)
Pt removed his c-collar after being advised of risk
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
## 2018-08-02 NOTE — Discharge Instructions (Addendum)
Follow-up with primary care doctor or orthopedic surgeon in 7-10 days for reevaluation and suture removal.  Return to the emergency department if symptoms worsen.  Take all antibiotics as prescribed.  Your liver enzymes are elevated as we have discussed, I recommend you discontinue drinking and follow-up with a family doctor for reevaluation of this.  See resources provided for alcohol abuse.  Apply thin layer of Neosporin twice daily to wound.  Keep wrapped in bandage and keep in boot anytime you are ambulating.  Keep dry.  May shower but do not soak in bath, standing water, hot tubs etc.  Take ibuprofen as needed for pain following dosing instructions as prescribed.
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
## 2018-08-05 ENCOUNTER — Encounter (HOSPITAL_COMMUNITY): Payer: Self-pay | Admitting: *Deleted

## 2018-08-05 LAB — I-STAT CG4 LACTIC ACID, ED: Lactic Acid, Venous: 2.1 mmol/L (ref 0.5–1.9)
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
## 2019-08-04 ENCOUNTER — Ambulatory Visit (INDEPENDENT_AMBULATORY_CARE_PROVIDER_SITE_OTHER): Payer: Self-pay

## 2019-08-04 ENCOUNTER — Other Ambulatory Visit: Payer: Self-pay

## 2019-08-04 ENCOUNTER — Ambulatory Visit (HOSPITAL_COMMUNITY)
Admission: EM | Admit: 2019-08-04 | Discharge: 2019-08-04 | Disposition: A | Discharge status: Home / Self Care | Payer: Self-pay | Attending: Family Medicine | Admitting: Family Medicine

## 2019-08-04 DIAGNOSIS — R0789 Other chest pain: Secondary | ICD-10-CM

## 2019-08-04 MED ORDER — KETOROLAC TROMETHAMINE 60 MG/2ML IM SOLN
INTRAMUSCULAR | Status: AC
  Filled 2019-08-04: qty 2

## 2019-08-04 MED ORDER — TIZANIDINE HCL 4 MG PO TABS: 4.0000 mg | ORAL_TABLET | Freq: Four times a day (QID) | ORAL | 0 refills | Status: AC | PRN

## 2019-08-04 MED ORDER — HYDROCODONE-ACETAMINOPHEN 7.5-325 MG PO TABS: 1.0000 | ORAL_TABLET | Freq: Four times a day (QID) | ORAL | 0 refills | Status: AC | PRN

## 2019-08-04 MED ORDER — KETOROLAC TROMETHAMINE 60 MG/2ML IM SOLN
60.0000 mg | Freq: Once | INTRAMUSCULAR | Status: AC
  Administered 2019-08-04: 60 mg via INTRAMUSCULAR

## 2019-08-04 MED ORDER — IBUPROFEN 800 MG PO TABS: 800.0000 mg | ORAL_TABLET | Freq: Three times a day (TID) | ORAL | 0 refills | Status: DC

## 2019-08-04 NOTE — Discharge Instructions (Signed)
Take ibuprofen 3 times a day with food.  This will help to reduce the pain and inflammation Take the muscle relaxer as needed.  This is especially helpful at night.  This will also help reduce pain. Drink plenty of fluids. When the pain is severe, and the ibuprofen is not helping, you may take the hydrocodone pain medicine.  Do not take hydrocodone and drive.  Do not drink alcohol while taking hydrocodone. Follow-up with the family practice doctor or primary care doctor if you fail to improve in a week

## 2019-08-04 NOTE — ED Provider Notes (Signed)
[place]    CSN: [id number] Arrival date & time: 08/04/19  1417      History   Chief Complaint Chief Complaint  Patient presents with  . Back Pain  . Shortness of Breath    HPI Leonard Pacheco is a 33 y.o. male.   HPI  Swell pain for 1 week.  Is been bothering him the back of his right side of his chest below his shoulder blade.  Hurts with deep breath.  Hurts with cough or sneeze.  Hurts with movement.  He has not had any shortness of breath, fever chills, body aches.  No sputum production.  He is a smoker.  He had no fall or injury.  He thinks is from doing repetitive lifting at work.  Previously worked as a Development worker, international aid.  States he is not currently working.  He is tried some over-the-counter medicine for pain.  He states that nothing is working.  Past Medical History:  Diagnosis Date  . Alcoholism ([place])   . Anxiety   . Back pain   . Depression     There are no active problems to display for this patient.   No past surgical history on file.     Home Medications    Prior to Admission medications   Medication Sig Start Date End Date Taking? Authorizing Provider  HYDROcodone-acetaminophen (NORCO) 7.5-325 MG tablet Take 1 tablet by mouth every 6 (six) hours as needed for moderate pain. 08/04/19   Raylene Everts, MD  ibuprofen (ADVIL) 800 MG tablet Take 1 tablet (800 mg total) by mouth 3 (three) times daily. 08/04/19   Raylene Everts, MD  tiZANidine (ZANAFLEX) 4 MG tablet Take 1-2 tablets (4-8 mg total) by mouth every 6 (six) hours as needed for muscle spasms. 08/04/19   Raylene Everts, MD    Family History No family history on file.  Social History Social History   Tobacco Use  . Smoking status: Current Every Day Smoker    Packs/day: 1.00    Years: 5.00    Pack years: 5.00    Types: Cigarettes  . Smokeless tobacco: Never Used  Substance Use Topics  . Alcohol use: Yes  . Drug use: Yes    Types: IV, Marijuana, Cocaine   Comment: prior to this visit     Allergies   Patient has no known allergies.   Review of Systems Review of Systems  Constitutional: Negative for chills and fever.  HENT: Negative for ear pain and sore throat.   Eyes: Negative for pain and visual disturbance.  Respiratory: Negative for cough and shortness of breath.   Cardiovascular: Negative for chest pain and palpitations.  Gastrointestinal: Negative for abdominal pain and vomiting.  Genitourinary: Negative for dysuria and hematuria.  Musculoskeletal: Positive for back pain. Negative for arthralgias.  Skin: Negative for color change and rash.  Neurological: Negative for seizures and syncope.  All other systems reviewed and are negative.    Physical Exam Triage Vital Signs ED Triage Vitals  Enc Vitals Group     BP 08/04/19 1457 129/81     Pulse Rate 08/04/19 1457 85     Resp 08/04/19 1457 18     Temp 08/04/19 1457 97.8 F (36.6 C)     Temp Source 08/04/19 1457 Oral     SpO2 08/04/19 1457 100 %     Weight 08/04/19 1456 188 lb (85.3 kg)     Height --      Head Circumference --  Peak Flow --      Pain Score 08/04/19 1456 10     Pain Loc --      Pain Edu? --      Excl. in GC? --    No data found.  Updated Vital Signs BP 129/81 (BP Location: Right Arm)   Pulse 85   Temp 97.8 F (36.6 C) (Oral)   Resp 18   Wt 85.3 kg   SpO2 100%   BMI 26.98 kg/m        Physical Exam Constitutional:      General: He is not in acute distress.    Appearance: He is well-developed.  HENT:     Head: Normocephalic and atraumatic.  Eyes:     Conjunctiva/sclera: Conjunctivae normal.     Pupils: Pupils are equal, round, and reactive to light.  Neck:     Musculoskeletal: Normal range of motion.  Cardiovascular:     Rate and Rhythm: Normal rate.  Pulmonary:     Effort: Pulmonary effort is normal. No respiratory distress.    Abdominal:     General: There is no distension.     Palpations: Abdomen is soft.   Musculoskeletal: Normal range of motion.  Skin:    General: Skin is warm and dry.  Neurological:     Mental Status: He is alert.      UC Treatments / Results  Labs (all labs ordered are listed, but only abnormal results are displayed) Labs Reviewed - No data to display  EKG   Radiology Dg Chest 2 View  Result Date: 08/04/2019 CLINICAL DATA:  Chest pain and shortness of breath EXAM: CHEST - 2 VIEW COMPARISON:  August 02, 2018 FINDINGS: There is no edema or consolidation. Heart size and pulmonary vascularity are normal. No adenopathy. There is degenerative change in the thoracic spine. IMPRESSION: No edema or consolidation. Electronically Signed   By: Bretta BangWilliam  Woodruff III M.D.   On: 08/04/2019 16:42    Procedures Procedures (including critical care time)  Medications Ordered in UC Medications  ketorolac (TORADOL) injection 60 mg (60 mg Intramuscular Given 08/04/19 1704)  ketorolac (TORADOL) 60 MG/2ML injection (has no administration in time range)    Initial Impression / Assessment and Plan / UC Course  I have reviewed the triage vital signs and the nursing notes.  Pertinent labs & imaging results that were available during my care of the patient were reviewed by me and considered in my medical decision making (see chart for details).     Normal x-ray.  Tender ribs.  Chest wall pain.  Discussed Final Clinical Impressions(s) / UC Diagnoses   Final diagnoses:  Pain, chest wall     Discharge Instructions     Take ibuprofen 3 times a day with food.  This will help to reduce the pain and inflammation Take the muscle relaxer as needed.  This is especially helpful at night.  This will also help reduce pain. Drink plenty of fluids. When the pain is severe, and the ibuprofen is not helping, you may take the hydrocodone pain medicine.  Do not take hydrocodone and drive.  Do not drink alcohol while taking hydrocodone. Follow-up with the family practice doctor or primary care  doctor if you fail to improve in a week   ED Prescriptions    Medication Sig Dispense Auth. Provider   ibuprofen (ADVIL) 800 MG tablet Take 1 tablet (800 mg total) by mouth 3 (three) times daily. 21 tablet Eustace MooreNelson, Odis Wickey Sue, MD  HYDROcodone-acetaminophen (NORCO) 7.5-325 MG tablet Take 1 tablet by mouth every 6 (six) hours as needed for moderate pain. 15 tablet Eustace MooreNelson, Juaquina Machnik Sue, MD   tiZANidine (ZANAFLEX) 4 MG tablet Take 1-2 tablets (4-8 mg total) by mouth every 6 (six) hours as needed for muscle spasms. 21 tablet Eustace MooreNelson, Devon Kingdon Sue, MD     Controlled Substance Prescriptions [place] Controlled Substance Registry consulted? Yes, I have consulted the [place] Controlled Substances Registry for this patient, and feel the risk/benefit ratio today is favorable for proceeding with this prescription for a controlled substance.   Eustace MooreNelson, Zoii Florer Sue, MD 08/04/19 Jerene Bears1920

## 2019-08-04 NOTE — ED Triage Notes (Signed)
Pt states he has SOB and back pain x 1 week.

## 2019-10-21 ENCOUNTER — Emergency Department (HOSPITAL_COMMUNITY)
Admission: EM | Admit: 2019-10-21 | Discharge: 2019-10-21 | Disposition: A | Discharge status: Home / Self Care | Payer: Self-pay | Attending: Emergency Medicine | Admitting: Emergency Medicine

## 2019-10-21 DIAGNOSIS — F17210 Nicotine dependence, cigarettes, uncomplicated: Secondary | ICD-10-CM | POA: Insufficient documentation

## 2019-10-21 DIAGNOSIS — F10120 Alcohol abuse with intoxication, uncomplicated: Secondary | ICD-10-CM | POA: Insufficient documentation

## 2019-10-21 DIAGNOSIS — T401X1A Poisoning by heroin, accidental (unintentional), initial encounter: Secondary | ICD-10-CM | POA: Insufficient documentation

## 2019-10-21 LAB — CBC WITH DIFFERENTIAL/PLATELET
Abs Immature Granulocytes: 0.5 K/uL — ABNORMAL HIGH (ref 0.00–0.07)
Basophils Absolute: 0 K/uL (ref 0.0–0.1)
Basophils Relative: 0 %
Eosinophils Absolute: 0 K/uL (ref 0.0–0.5)
Eosinophils Relative: 0 %
HCT: 47.4 % (ref 39.0–52.0)
Hemoglobin: 15.6 g/dL (ref 13.0–17.0)
Lymphocytes Relative: 8 %
Lymphs Abs: 1.3 K/uL (ref 0.7–4.0)
MCH: 30.5 pg (ref 26.0–34.0)
MCHC: 32.9 g/dL (ref 30.0–36.0)
MCV: 92.8 fL (ref 80.0–100.0)
Metamyelocytes Relative: 1 %
Monocytes Absolute: 0.6 K/uL (ref 0.1–1.0)
Monocytes Relative: 4 %
Myelocytes: 2 %
Neutro Abs: 13.5 K/uL — ABNORMAL HIGH (ref 1.7–7.7)
Neutrophils Relative %: 85 %
Platelets: 228 K/uL (ref 150–400)
RBC: 5.11 MIL/uL (ref 4.22–5.81)
RDW: 13.3 % (ref 11.5–15.5)
WBC: 15.9 K/uL — ABNORMAL HIGH (ref 4.0–10.5)
nRBC: 0 % (ref 0.0–0.2)
nRBC: 0 /100{WBCs}

## 2019-10-21 LAB — COMPREHENSIVE METABOLIC PANEL
ALT: 43 U/L (ref 0–44)
AST: 49 U/L — ABNORMAL HIGH (ref 15–41)
Albumin: 3.7 g/dL (ref 3.5–5.0)
Alkaline Phosphatase: 84 U/L (ref 38–126)
Anion gap: 15 (ref 5–15)
BUN: 11 mg/dL (ref 6–20)
CO2: 18 mmol/L — ABNORMAL LOW (ref 22–32)
Calcium: 8.3 mg/dL — ABNORMAL LOW (ref 8.9–10.3)
Chloride: 100 mmol/L (ref 98–111)
Creatinine, Ser: 1.13 mg/dL (ref 0.61–1.24)
GFR calc Af Amer: >60 mL/min (ref >60)
GFR calc non Af Amer: >60 mL/min (ref >60)
Glucose, Bld: 273 mg/dL — ABNORMAL HIGH (ref 70–99)
Potassium: 3.7 mmol/L (ref 3.5–5.1)
Sodium: 133 mmol/L — ABNORMAL LOW (ref 135–145)
Total Bilirubin: 0.5 mg/dL (ref 0.3–1.2)
Total Protein: 7.4 g/dL (ref 6.5–8.1)

## 2019-10-21 LAB — CBG MONITORING, ED: Glucose-Capillary: 294 mg/dL — ABNORMAL HIGH (ref 70–99)

## 2019-10-21 MED ORDER — NALOXONE HCL 4 MG/0.1ML NA LIQD
1.0000 | Freq: Once | NASAL | Status: AC
  Administered 2019-10-21: 21:00:00 1 via NASAL
  Filled 2019-10-21: qty 4

## 2019-10-21 MED ORDER — SODIUM CHLORIDE 0.9 % IV BOLUS
1000.0000 mL | Freq: Once | INTRAVENOUS | Status: AC
  Administered 2019-10-21: 19:00:00 1000 mL via INTRAVENOUS

## 2019-10-21 NOTE — ED Provider Notes (Signed)
[facility] EMERGENCY DEPARTMENT Provider Note   CSN: [id number] Arrival date & time: 10/21/19  1832     History   Chief Complaint Chief Complaint  Patient presents with  . Drug Overdose    HPI Leonard Pacheco is a 33 y.o. male.     33 yo M with a cc of a heroin overdose.  Had been drinking as well.  Needed narcarn per ems.  +ETOH on board. Denies other ingestion.  Level V caveat intoxication.   The history is provided by the patient.  Drug Overdose This is a new problem. The problem occurs constantly. The problem has not changed since onset.Pertinent negatives include no chest pain, no abdominal pain, no headaches and no shortness of breath. Nothing aggravates the symptoms. Nothing relieves the symptoms. He has tried nothing for the symptoms. The treatment provided no relief.    Past Medical History:  Diagnosis Date  . Alcoholism ([place])   . Anxiety   . Back pain   . Depression     There are no active problems to display for this patient.   No past surgical history on file.      Home Medications    Prior to Admission medications   Medication Sig Start Date End Date Taking? Authorizing Provider  HYDROcodone-acetaminophen (NORCO) 7.5-325 MG tablet Take 1 tablet by mouth every 6 (six) hours as needed for moderate pain. 08/04/19   Raylene Everts, MD  ibuprofen (ADVIL) 800 MG tablet Take 1 tablet (800 mg total) by mouth 3 (three) times daily. 08/04/19   Raylene Everts, MD  tiZANidine (ZANAFLEX) 4 MG tablet Take 1-2 tablets (4-8 mg total) by mouth every 6 (six) hours as needed for muscle spasms. 08/04/19   Raylene Everts, MD    Family History No family history on file.  Social History Social History   Tobacco Use  . Smoking status: Current Every Day Smoker    Packs/day: 1.00    Years: 5.00    Pack years: 5.00    Types: Cigarettes  . Smokeless tobacco: Never Used  Substance Use Topics  . Alcohol use: Yes  . Drug use:  Yes    Types: IV, Marijuana, Cocaine    Comment: prior to this visit     Allergies   Patient has no known allergies.   Review of Systems Review of Systems  Constitutional: Negative for chills and fever.  HENT: Negative for congestion and facial swelling.   Eyes: Negative for discharge and visual disturbance.  Respiratory: Negative for shortness of breath.   Cardiovascular: Negative for chest pain and palpitations.  Gastrointestinal: Negative for abdominal pain, diarrhea and vomiting.  Musculoskeletal: Negative for arthralgias and myalgias.  Skin: Negative for color change and rash.  Neurological: Negative for tremors, syncope and headaches.  Psychiatric/Behavioral: Negative for confusion and dysphoric mood.     Physical Exam Updated Vital Signs BP 122/83 (BP Location: Right Arm)   Pulse (!) 118   Temp 98.3 F (36.8 C) (Oral)   Resp (!) 22   Ht 5\' 10"  (1.778 m)   Wt 87.1 kg   SpO2 98%   BMI 27.55 kg/m   Physical Exam Vitals signs and nursing note reviewed.  Constitutional:      Appearance: He is well-developed.  HENT:     Head: Normocephalic and atraumatic.  Eyes:     Pupils: Pupils are equal, round, and reactive to light.  Neck:     Musculoskeletal: Normal range of motion  and neck supple.     Vascular: No JVD.  Cardiovascular:     Rate and Rhythm: Regular rhythm. Tachycardia present.     Heart sounds: No murmur. No friction rub. No gallop.   Pulmonary:     Effort: No respiratory distress.     Breath sounds: No wheezing.  Abdominal:     General: There is no distension.     Tenderness: There is no guarding or rebound.  Musculoskeletal: Normal range of motion.  Skin:    Coloration: Skin is not pale.     Findings: No rash.  Neurological:     Mental Status: He is alert.     Comments: Sleepy on arrival.   Psychiatric:        Behavior: Behavior normal.      ED Treatments / Results  Labs (all labs ordered are listed, but only abnormal results are  displayed) Labs Reviewed  CBC WITH DIFFERENTIAL/PLATELET - Abnormal; Notable for the following components:      Result Value   WBC 15.9 (*)    Neutro Abs 13.5 (*)    Abs Immature Granulocytes 0.50 (*)    All other components within normal limits  COMPREHENSIVE METABOLIC PANEL - Abnormal; Notable for the following components:   Sodium 133 (*)    CO2 18 (*)    Glucose, Bld 273 (*)    Calcium 8.3 (*)    AST 49 (*)    All other components within normal limits  CBG MONITORING, ED - Abnormal; Notable for the following components:   Glucose-Capillary 294 (*)    All other components within normal limits    EKG None  Radiology No results found.  Procedures Procedures (including critical care time)  Medications Ordered in ED Medications  naloxone (NARCAN) nasal spray 4 mg/0.1 mL (has no administration in time range)  sodium chloride 0.9 % bolus 1,000 mL (1,000 mLs Intravenous New Bag/Given 10/21/19 1904)     Initial Impression / Assessment and Plan / ED Course  I have reviewed the triage vital signs and the nursing notes.  Pertinent labs & imaging results that were available during my care of the patient were reviewed by me and considered in my medical decision making (see chart for details).        33 yo M with a cc of ams.  Found unresponsive in the bathroom at a gas station.  Patient not breathing, requiring narcan.  Arrived sleepy but answering questions.  Denies trauma, denies recent illness.  Will obs in the ED for possible worsening.   The patient was observed in the ED for 2 hours with improvement of his mental status.  Patient is able to tolerate p.o. without difficulty.  He is still mildly tachycardic though I expect that this is him withdrawing from his opiates.  Will discharge the patient home.  Given a list of outpatient resources for help.  8:28 PM:  I have discussed the diagnosis/risks/treatment options with the patient and believe the pt to be eligible for  discharge home to follow-up with PCP. We also discussed returning to the ED immediately if new or worsening sx occur. We discussed the sx which are most concerning (e.g., sudden worsening pain, fever, inability to tolerate by mouth) that necessitate immediate return. Medications administered to the patient during their visit and any new prescriptions provided to the patient are listed below.  Medications given during this visit Medications  naloxone (NARCAN) nasal spray 4 mg/0.1 mL (has no administration in time  range)  sodium chloride 0.9 % bolus 1,000 mL (1,000 mLs Intravenous New Bag/Given 10/21/19 1904)     The patient appears reasonably screen and/or stabilized for discharge and I doubt any other medical condition or other [facility] requiring further screening, evaluation, or treatment in the ED at this time prior to discharge.    Final Clinical Impressions(s) / ED Diagnoses   Final diagnoses:  Accidental overdose of heroin, initial encounter [facility])    ED Discharge Orders    None       Melene Plan, DO 10/21/19 2028

## 2019-10-21 NOTE — Discharge Instructions (Signed)
There is help if you need it.  Please do not use dirty needles, this could cause you a severe infection to your skin, heart or spinal cord.  This could kill you or leave you permanently disabled.  There was a recent study done at [facility] that showed that the risk of death for someone that had unintentionally overdosed on narcotics was as high as 15% in the next year.  This is much higher than most every other medical condition.  [place] [facility] to the Opioid Problem ([facility]) Fixed; mobile; peer-based Chase Holleman [phone] [email or web address] Fixed site exchange at [employer], Wednesdays (2-5pm) and Thursdays (4-8pm). [address] [place], [place] [address] Call or text to arrange mobile and peer exchange, Mondays (1-4pm) and Fridays (4-7pm). Serving [place]  

## 2019-10-21 NOTE — ED Triage Notes (Signed)
PT CAME IN VIA EMS; REPORTED WAS FOUND UNCONSCIOUS IN THE BATHROOM AT A TRAIN STATION. REPORTED HEROINE USE BY SIGNIFICANT OTHER. PER EMS NARCAN 2 MG GIVEN AND RESCUE BREATHING FOR 10 MINS. PT ENDORSED SOME HEROINE USE AND SOME ALCOHOL.

## 2020-10-09 ENCOUNTER — Encounter (HOSPITAL_COMMUNITY): Payer: Self-pay | Admitting: Emergency Medicine

## 2020-10-09 ENCOUNTER — Other Ambulatory Visit: Payer: Self-pay

## 2020-10-09 ENCOUNTER — Emergency Department (HOSPITAL_COMMUNITY)
Admission: EM | Admit: 2020-10-09 | Discharge: 2020-10-09 | Disposition: A | Discharge status: Home / Self Care | Payer: Self-pay | Attending: Emergency Medicine | Admitting: Emergency Medicine

## 2020-10-09 DIAGNOSIS — F17210 Nicotine dependence, cigarettes, uncomplicated: Secondary | ICD-10-CM | POA: Insufficient documentation

## 2020-10-09 DIAGNOSIS — M545 Low back pain, unspecified: Secondary | ICD-10-CM

## 2020-10-09 MED ORDER — IBUPROFEN 800 MG PO TABS: 800.0000 mg | ORAL_TABLET | Freq: Three times a day (TID) | ORAL | 0 refills | Status: AC | PRN

## 2020-10-09 MED ORDER — KETOROLAC TROMETHAMINE 30 MG/ML IJ SOLN
30.0000 mg | Freq: Once | INTRAMUSCULAR | Status: AC
  Administered 2020-10-09: 30 mg via INTRAMUSCULAR
  Filled 2020-10-09: qty 1

## 2020-10-09 NOTE — Discharge Instructions (Signed)
Please read instructions below.  You can take ibuprofen every 8 hours as needed for pain.   Apply ice to your back for 20 minutes at a time.  You can also apply heat if this provides more relief.   Establish primary care. They can provide primary care and financial assistance for medications and health care.  Return to ER if new fever, numbness or tingling in your arms or legs, inability to urinate, inability to hold your bowels, or weakness in your extremities.

## 2020-10-09 NOTE — ED Triage Notes (Signed)
Tylenol 1000 mg. po given by EMS

## 2020-10-09 NOTE — ED Triage Notes (Signed)
EMS stated, having back pain for 3 days.

## 2020-10-09 NOTE — ED Provider Notes (Signed)
[facility] EMERGENCY DEPARTMENT Provider Note   CSN: [id number] Arrival date & time: 10/09/20  9675     History Chief Complaint  Patient presents with   Back Pain    Leonard Pacheco is a 34 y.o. male past medical history of polysubstance abuse, back pain, presenting to the emergency department for mid to lower back pain that has been occurring for about 3 days.  He states pain initially was bothersome, however is now throbbing in nature and constant.  He states it radiates down his back, is worse with movement.  He does not have radiating pain down his legs, no bowel or bladder incontinence, no saddle paresthesia, numbness or weakness in extremities.  Denies current IV drug use.  Denies abdominal complaints, fevers, chills, urinary symptoms.  He states he does not feel unwell, only has pain. No recent injuries or heavy lifting. He has not treated his symptoms prior to arrival, EMS administered 1 g of Tylenol which has been providing some improvement in symptoms.  He states he does not have a PCP, does not have health insurance, he is homeless though lives within walking distance to this hospital.  The history is provided by the patient.       Past Medical History:  Diagnosis Date   Alcoholism (HCC)    Anxiety    Back pain    Depression     There are no problems to display for this patient.   History reviewed. No pertinent surgical history.     No family history on file.  Social History   Tobacco Use   Smoking status: Current Every Day Smoker    Packs/day: 1.00    Years: 5.00    Pack years: 5.00    Types: Cigarettes   Smokeless tobacco: Never Used  Vaping Use   Vaping Use: Never used  Substance Use Topics   Alcohol use: Yes   Drug use: Yes    Types: IV, Marijuana, Cocaine    Comment: prior to this visit    Home Medications Prior to Admission medications   Medication Sig Start Date End Date Taking? Authorizing Provider    HYDROcodone-acetaminophen (NORCO) 7.5-325 MG tablet Take 1 tablet by mouth every 6 (six) hours as needed for moderate pain. 08/04/19   Eustace Moore, MD  ibuprofen (ADVIL) 800 MG tablet Take 1 tablet (800 mg total) by mouth every 8 (eight) hours as needed for moderate pain. 10/09/20   Casondra Gasca, [place], PA-C  tiZANidine (ZANAFLEX) 4 MG tablet Take 1-2 tablets (4-8 mg total) by mouth every 6 (six) hours as needed for muscle spasms. 08/04/19   Eustace Moore, MD    Allergies    Patient has no known allergies.  Review of Systems   Review of Systems  Constitutional: Negative for chills and fever.  Gastrointestinal: Negative for abdominal pain.  Genitourinary: Negative for difficulty urinating and dysuria.  Musculoskeletal: Positive for back pain.  Neurological: Negative for weakness and numbness.    Physical Exam Updated Vital Signs BP 117/78 (BP Location: Right Arm)    Pulse 88    Temp 97.8 F (36.6 C) (Oral)    Resp 18    Ht 5\' 11"  (1.803 m)    Wt 81.6 kg    SpO2 99%    BMI 25.10 kg/m   Physical Exam Vitals and nursing note reviewed.  Constitutional:      General: He is not in acute distress.    Appearance: He is well-developed.  He is not ill-appearing.  HENT:     Head: Normocephalic and atraumatic.  Eyes:     Conjunctiva/sclera: Conjunctivae normal.  Cardiovascular:     Rate and Rhythm: Normal rate and regular rhythm.  Pulmonary:     Effort: Pulmonary effort is normal. No respiratory distress.     Breath sounds: Normal breath sounds.  Abdominal:     General: Bowel sounds are normal.     Tenderness: There is no abdominal tenderness.  Musculoskeletal:     Comments: Generalized TTP to T and L spine, as well as paraspinal musculature R>L. No deformity or skin changes noted. Positive straight leg raise.  Neurological:     Mental Status: He is alert.     Comments: Normal tone.  5/5 strength in BLE including strong and equal dorsiflexion/plantar flexion Sensory: light  touch normal in BLE extremities.   Gait: normal gait and balance CV: distal pulses palpable throughout    Psychiatric:        Mood and Affect: Mood normal.        Behavior: Behavior normal.     ED Results / Procedures / Treatments   Labs (all labs ordered are listed, but only abnormal results are displayed) Labs Reviewed - No data to display  EKG None  Radiology No results found.  Procedures Procedures (including critical care time)  Medications Ordered in ED Medications  ketorolac (TORADOL) 30 MG/ML injection 30 mg (has no administration in time range)    ED Course  I have reviewed the triage vital signs and the nursing notes.  Pertinent labs & imaging results that were available during my care of the patient were reviewed by me and considered in my medical decision making (see chart for details).    MDM Rules/Calculators/A&P                          Patient with back pain, history of the same. He is homeless and sleeps in a tent. No known injuries or heavy lifting.   No neurological deficits and normal neuro exam.  Patient can walk but states is painful.  No loss of bowel or bladder control.  No concern for cauda equina.  Does have documented hx of IVDU, though denies current use. He denies feeling ill, fever or chills.  RICE protocol and pain medicine indicated and discussed with patient. Will provide referral to PCP that is within walking distance of [place].  Final Clinical Impression(s) / ED Diagnoses Final diagnoses:  Acute bilateral low back pain without sciatica    Rx / DC Orders ED Discharge Orders         Ordered    ibuprofen (ADVIL) 800 MG tablet  Every 8 hours PRN        10/09/20 1025           Kristal Perl, [place] N, [place] 10/09/20 1026    Jacalyn Lefevre, MD 10/09/20 1036

## 2020-12-12 ENCOUNTER — Other Ambulatory Visit: Payer: Self-pay

## 2020-12-12 ENCOUNTER — Emergency Department (HOSPITAL_COMMUNITY)
Admission: EM | Admit: 2020-12-12 | Discharge: 2020-12-12 | Disposition: A | Discharge status: Home / Self Care | Payer: Self-pay | Attending: Emergency Medicine | Admitting: Emergency Medicine

## 2020-12-12 ENCOUNTER — Encounter (HOSPITAL_COMMUNITY): Payer: Self-pay | Admitting: Emergency Medicine

## 2020-12-12 DIAGNOSIS — F1190 Opioid use, unspecified, uncomplicated: Secondary | ICD-10-CM | POA: Insufficient documentation

## 2020-12-12 DIAGNOSIS — R5381 Other malaise: Secondary | ICD-10-CM | POA: Insufficient documentation

## 2020-12-12 DIAGNOSIS — M7918 Myalgia, other site: Secondary | ICD-10-CM | POA: Insufficient documentation

## 2020-12-12 DIAGNOSIS — Z5321 Procedure and treatment not carried out due to patient leaving prior to being seen by health care provider: Secondary | ICD-10-CM | POA: Insufficient documentation

## 2020-12-12 NOTE — ED Triage Notes (Signed)
[facility] transported pt from [facility] community behind [employer] on [address]   Pt reporting used heroin 2 days ago. Since that time he reports malaise and body aches.

## 2020-12-12 NOTE — ED Notes (Signed)
Patient called for room placement x2 with no answer.
